# Patient Record
Sex: Female | Born: 1945 | Race: White | Hispanic: No | Smoking: Never smoker
Health system: Southern US, Community
[De-identification: ages and names within clinical notes are randomized; demographics above are authoritative.]

## PROBLEM LIST (undated history)

## (undated) DIAGNOSIS — E785 Hyperlipidemia, unspecified: Secondary | ICD-10-CM

## (undated) DIAGNOSIS — I10 Essential (primary) hypertension: Secondary | ICD-10-CM

## (undated) HISTORY — PX: BACK SURGERY: SHX140

## (undated) HISTORY — PX: ABDOMINAL HYSTERECTOMY: SHX81

---

## 2008-09-10 HISTORY — PX: TOTAL KNEE ARTHROPLASTY: SHX125

## 2012-01-24 ENCOUNTER — Encounter (HOSPITAL_COMMUNITY): Payer: Self-pay | Admitting: Pharmacy Technician

## 2012-01-24 ENCOUNTER — Other Ambulatory Visit: Payer: Self-pay | Admitting: Cardiovascular Disease

## 2012-01-25 ENCOUNTER — Encounter (HOSPITAL_COMMUNITY): Payer: Self-pay | Admitting: Cardiology

## 2012-01-25 ENCOUNTER — Ambulatory Visit (HOSPITAL_COMMUNITY)
Admission: RE | Admit: 2012-01-25 | Discharge: 2012-01-25 | Disposition: A | Discharge status: Home / Self Care | Payer: Medicare Other | Source: Ambulatory Visit | Attending: Cardiovascular Disease | Admitting: Cardiovascular Disease

## 2012-01-25 ENCOUNTER — Encounter (HOSPITAL_COMMUNITY)
Admission: RE | Disposition: A | Discharge status: Home / Self Care | Payer: Self-pay | Source: Ambulatory Visit | Attending: Cardiovascular Disease

## 2012-01-25 DIAGNOSIS — G629 Polyneuropathy, unspecified: Secondary | ICD-10-CM | POA: Diagnosis present

## 2012-01-25 DIAGNOSIS — I2510 Atherosclerotic heart disease of native coronary artery without angina pectoris: Secondary | ICD-10-CM | POA: Insufficient documentation

## 2012-01-25 DIAGNOSIS — R079 Chest pain, unspecified: Secondary | ICD-10-CM | POA: Insufficient documentation

## 2012-01-25 DIAGNOSIS — I200 Unstable angina: Secondary | ICD-10-CM | POA: Diagnosis present

## 2012-01-25 DIAGNOSIS — I10 Essential (primary) hypertension: Secondary | ICD-10-CM | POA: Diagnosis present

## 2012-01-25 DIAGNOSIS — E669 Obesity, unspecified: Secondary | ICD-10-CM | POA: Diagnosis present

## 2012-01-25 DIAGNOSIS — E785 Hyperlipidemia, unspecified: Secondary | ICD-10-CM | POA: Diagnosis present

## 2012-01-25 DIAGNOSIS — R0602 Shortness of breath: Secondary | ICD-10-CM | POA: Insufficient documentation

## 2012-01-25 DIAGNOSIS — Z8249 Family history of ischemic heart disease and other diseases of the circulatory system: Secondary | ICD-10-CM

## 2012-01-25 DIAGNOSIS — R931 Abnormal findings on diagnostic imaging of heart and coronary circulation: Secondary | ICD-10-CM | POA: Diagnosis present

## 2012-01-25 DIAGNOSIS — E119 Type 2 diabetes mellitus without complications: Secondary | ICD-10-CM | POA: Diagnosis present

## 2012-01-25 HISTORY — DX: Hyperlipidemia, unspecified: E78.5

## 2012-01-25 HISTORY — DX: Essential (primary) hypertension: I10

## 2012-01-25 HISTORY — PX: LEFT HEART CATHETERIZATION WITH CORONARY ANGIOGRAM: SHX5451

## 2012-01-25 LAB — CBC
MCH: 28 pg (ref 26.0–34.0)
MCHC: 33.1 g/dL (ref 30.0–36.0)
MCV: 84.5 fL (ref 78.0–100.0)
Platelets: 219 10*3/uL (ref 150–400)
RDW: 15.2 % (ref 11.5–15.5)
WBC: 9 10*3/uL (ref 4.0–10.5)

## 2012-01-25 LAB — BASIC METABOLIC PANEL
CO2: 28 mEq/L (ref 19–32)
Calcium: 9.4 mg/dL (ref 8.4–10.5)
Creatinine, Ser: 0.7 mg/dL (ref 0.50–1.10)
Glucose, Bld: 101 mg/dL — ABNORMAL HIGH (ref 70–99)

## 2012-01-25 SURGERY — LEFT HEART CATHETERIZATION WITH CORONARY ANGIOGRAM
Anesthesia: LOCAL

## 2012-01-25 MED ORDER — ONDANSETRON HCL 4 MG/2ML IJ SOLN: 4.0000 mg | Freq: Four times a day (QID) | INTRAMUSCULAR | Status: DC | PRN

## 2012-01-25 MED ORDER — ALPRAZOLAM 0.25 MG PO TABS: 0.2500 mg | ORAL_TABLET | Freq: Three times a day (TID) | ORAL | Status: DC | PRN

## 2012-01-25 MED ORDER — ASPIRIN 81 MG PO CHEW: 81.0000 mg | CHEWABLE_TABLET | Freq: Every day | ORAL | Status: DC

## 2012-01-25 MED ORDER — HEPARIN (PORCINE) IN NACL 2-0.9 UNIT/ML-% IJ SOLN
INTRAMUSCULAR | Status: AC
  Filled 2012-01-25: qty 2000

## 2012-01-25 MED ORDER — SODIUM CHLORIDE 0.9 % IV SOLN: INTRAVENOUS | Status: DC

## 2012-01-25 MED ORDER — NITROGLYCERIN 0.2 MG/ML ON CALL CATH LAB
INTRAVENOUS | Status: AC
  Filled 2012-01-25: qty 1

## 2012-01-25 MED ORDER — SODIUM CHLORIDE 0.9 % IJ SOLN: 3.0000 mL | INTRAMUSCULAR | Status: DC | PRN

## 2012-01-25 MED ORDER — ACETAMINOPHEN 325 MG PO TABS: 650.0000 mg | ORAL_TABLET | ORAL | Status: DC | PRN

## 2012-01-25 MED ORDER — DIAZEPAM 5 MG PO TABS
ORAL_TABLET | ORAL | Status: AC
  Filled 2012-01-25: qty 1

## 2012-01-25 MED ORDER — DIAZEPAM 5 MG PO TABS
5.0000 mg | ORAL_TABLET | ORAL | Status: AC
  Administered 2012-01-25: 5 mg via ORAL

## 2012-01-25 MED ORDER — INSULIN ASPART 100 UNIT/ML ~~LOC~~ SOLN: 0.0000 [IU] | Freq: Three times a day (TID) | SUBCUTANEOUS | Status: DC

## 2012-01-25 MED ORDER — SODIUM CHLORIDE 0.9 % IV SOLN
INTRAVENOUS | Status: DC
  Administered 2012-01-25: 13:00:00 via INTRAVENOUS

## 2012-01-25 MED ORDER — ZOLPIDEM TARTRATE 10 MG PO TABS: 10.0000 mg | ORAL_TABLET | Freq: Every evening | ORAL | Status: DC | PRN

## 2012-01-25 MED ORDER — INSULIN ASPART 100 UNIT/ML ~~LOC~~ SOLN: 0.0000 [IU] | Freq: Every day | SUBCUTANEOUS | Status: DC

## 2012-01-25 MED ORDER — ASPIRIN 81 MG PO CHEW
CHEWABLE_TABLET | ORAL | Status: AC
  Filled 2012-01-25: qty 4

## 2012-01-25 MED ORDER — LIDOCAINE HCL (PF) 1 % IJ SOLN
INTRAMUSCULAR | Status: AC
  Filled 2012-01-25: qty 30

## 2012-01-25 NOTE — H&P (Signed)
Patient ID: Brandi Marquez MRN: [id number], DOB/AGE: 68-Jun-1947   Admit date: 01/25/2012   Primary Physician: No primary provider on file. Primary Cardiologist: Brandi Felts PA-C  HPI: Pleasant 66 y/o female with a history of HTN, DM, dyslipidemia, as well as a family history of CAD, referred to [place] for diagnostic cath. Brandi Marquez had a work up in Sept 2009 for SOB. Echo showed an EF of 70% with LVH. Myoview was abnormal with scar and superimposed ischemia. She was treated medically. Monday this week she was driving her grandson home from school when she developed Lt su scapular pain-"like a knife". Her symptoms were worse with inspiration adn she felt SOB. [facility]. In retrospect, the admits to a history of "weak spells" related to exertion and associated with diaphoresis which she attributed to low blood sugar. She saw Brandi Marquez earlier this weak and was put on Imdur and set up for cath. She has not had any more chest pain but she says she was up most of the night with "aching" pain in her Lt arm.   Problem List: Past Medical History  Diagnosis Date  . Diabetes mellitus   . HTN (hypertension)   . Dyslipidemia     Past Surgical History  Procedure Date  . Back surgery '95 X 2  . Abdominal hysterectomy   . Total knee arthroplasty 2010    Rt     Allergies:  Allergies  Allergen Reactions  . Codeine      Home Medications Prescriptions prior to admission  Medication Sig Dispense Refill  . escitalopram (LEXAPRO) 20 MG tablet Take 20 mg by mouth daily.      Marland Kitchen esomeprazole (NEXIUM) 40 MG capsule Take 40 mg by mouth 2 (two) times daily.      . fenofibrate (TRICOR) 48 MG tablet Take 48 mg by mouth daily.      . metFORMIN (GLUCOPHAGE-XR) 500 MG 24 hr tablet Take 500 mg by mouth daily with breakfast.      . pregabalin (LYRICA) 150 MG capsule Take 150 mg by mouth 2 (two) times daily.      Marland Kitchen PRESCRIPTION MEDICATION Apply 1 application topically 4 (four) times daily.  Compounded cream from "[facility] [employer]". This medication has several ingredients which are unknown to pt      . ramipril (ALTACE) 10 MG capsule Take 10 mg by mouth daily.      . simvastatin (ZOCOR) 20 MG tablet Take 20 mg by mouth every evening.         Family History  Problem Relation Age of Onset  . Coronary artery disease Father 31     History   Social History  . Marital Status: Married    Spouse Name: N/A    Number of Children: 2  . Years of Education: N/A   Occupational History  . Not on file.   Social History Main Topics  . Smoking status: Never Smoker   . Smokeless tobacco: Not on file  . Alcohol Use: Not on file  . Drug Use: Not on file  . Sexually Active: Not on file   Other Topics Concern  . Not on file   Social History Narrative  . No narrative on file     Review of Systems: No history of GI bleeding No history of renal disease. No retinopathy or neuropathy from DM. She has been Rxd for "prerssure" on her eyes in the past.  No history  of syncope or palpitations. She has chronic numbness Rt foot after back surgery in '95.     Physical Exam: Blood pressure 159/83, pulse 65, temperature 97.8 F (36.6 C), temperature source Oral, resp. rate 18, height 5\' 8"  (1.727 m), weight 99.791 kg (220 lb), SpO2 92.00%.  General appearance: alert, cooperative, no distress and morbidly obese Neck: no carotid bruit, no JVD, supple, symmetrical, trachea midline and thyroid not enlarged, symmetric, no tenderness/mass/nodules Lungs: clear to auscultation bilaterally Heart: regular rate and rhythm, S1, S2 normal, no murmur, click, rub or gallop Abdomen: obese, midline surgical scar Extremities: extremities normal, atraumatic, no cyanosis or edema Pulses: 2+ and symmetric Skin: cool and dry Neurologic: Grossly normal    Labs:   Results for orders placed during the hospital encounter of 01/25/12 (from the past 24 hour(s))  CBC     Status: Normal   Collection  Time   01/25/12  1:34 PM      Component Value Range   WBC 9.0  4.0 - 10.5 (K/uL)   RBC 4.40  3.87 - 5.11 (MIL/uL)   Hemoglobin 12.3  12.0 - 15.0 (g/dL)   HCT 16.1  09.6 - 04.5 (%)   MCV 84.5  78.0 - 100.0 (fL)   MCH 28.0  26.0 - 34.0 (pg)   MCHC 33.1  30.0 - 36.0 (g/dL)   RDW 40.9  81.1 - 91.4 (%)   Platelets 219  150 - 400 (K/uL)  PROTIME-INR     Status: Normal   Collection Time   01/25/12  1:34 PM      Component Value Range   Prothrombin Time 13.7  11.6 - 15.2 (seconds)   INR 1.03  0.00 - 1.49      Radiology/Studies: No results found.  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 Her chest pain Monday is not as worrisome as her history of exertional weak spells and Lt arm pain last night.  Brandi Pretty, PA-C 01/25/2012, 2:05 PM potassium Agree with note written by Brandi Marquez PAC  Pt with CRF, recent CP and positive myoview for cath today.  Runell Gess 01/25/2012 3:56 PM

## 2012-01-25 NOTE — H&P (Signed)
   Pt was reexamined and existing H & P reviewed. No changes found.  Runell Gess, MD [facility] 01/25/2012 3:59 PM

## 2012-01-25 NOTE — Op Note (Signed)
Brandi Marquez is a 66 y.o. female    [id number] LOCATION:  FACILITY: [facility]  PHYSICIAN: Nanetta Batty, M.D. 1946/02/21   DATE OF PROCEDURE:  01/25/2012  DATE OF DISCHARGE:  [facility]  CARDIAC CATHETERIZATION     History obtained from chart review. Pleasant 66 y/o female with a history of HTN, DM, dyslipidemia, as well as a family history of CAD, referred to [place] for diagnostic cath. [name] had a work up in Sept 2009 for SOB. Echo showed an EF of 70% with LVH. Myoview was abnormal with scar and superimposed ischemia. She was treated medically. Monday this week she was driving her grandson home from school when she developed Lt su scapular pain-"like a knife". Her symptoms were worse with inspiration adn she felt SOB. [facility]. In retrospect, the admits to a history of "weak spells" related to exertion and associated with diaphoresis which she attributed to low blood sugar. She saw Arnette Felts earlier this weak and was put on Imdur and set up for cath. She has not had any more chest pain but she says she was up most of the night with "aching" pain in her Lt arm.    PROCEDURE DESCRIPTION:    The patient was brought to the second floor  [facility] lab in the postabsorptive state. She was  premedicated with Valium 5 mg by mouth. Her right groin was prepped and shaved in usual sterile fashion. Xylocaine 1% was used  for local anesthesia. A 5 French sheath was inserted into the right common femoral  artery using standard Seldinger technique. 5 French right and left Judkins diagnostic catheters along with a 5 French pigtail catheter were used for selective coronary angiography and left ventriculography respectively. Visipaque was used for the entirety of the case. Retrograde aortic and left ventricular pullback pressures were recorded.  HEMODYNAMICS:    AO SYSTOLIC/AO DIASTOLIC: 193/86   LV SYSTOLIC/LV DIASTOLIC:  190/20  ANGIOGRAPHIC RESULTS:   1. Left main; normal  2. LAD; minor irregularities 3. Left circumflex; normal.  4. Right coronary artery; dominant with a 40-50% focal mid PDA stenosis 5. Left ventriculography; RAO left ventriculogram was performed using  25 mL of Visipaque dye at 12 mL/second. The overall LVEF estimated  60 %Without wall motion abnormalities  IMPRESSION:Ms. Milford has essentially noncritical CAD with a moderate mid PDA stenosis which does not appear to be hemodynamically significant. I believe her chest pain is noncardiac and her mildly false positive. I recommend empiric anti-reflux therapy. The sheath was removed and pressure was held on the groin to achieve hemostasis. The patient left the lab in stable condition. She'll gently hydrated, discharged home after remaining recumbent for 4 hours. She'll see Arnette Felts, at [facility], back in followup.  Runell Gess MD, [facility] 01/25/2012 4:27 PM

## 2012-01-25 NOTE — Progress Notes (Signed)
UP AND WALKED AND TOLERATED WELL RIGHT GROIN STABLE;NO BLEEDING OR HEMATOMA; VOIDED IN BATHROOM

## 2012-01-25 NOTE — Discharge Instructions (Signed)

## 2013-02-10 ENCOUNTER — Other Ambulatory Visit (HOSPITAL_COMMUNITY): Payer: Self-pay | Admitting: Endocrinology

## 2013-02-10 DIAGNOSIS — C73 Malignant neoplasm of thyroid gland: Secondary | ICD-10-CM

## 2013-02-16 ENCOUNTER — Encounter (HOSPITAL_COMMUNITY)
Admission: RE | Admit: 2013-02-16 | Discharge: 2013-02-16 | Disposition: A | Discharge status: Home / Self Care | Payer: Medicare Other | Source: Ambulatory Visit | Attending: Endocrinology | Admitting: Endocrinology

## 2013-02-16 DIAGNOSIS — C73 Malignant neoplasm of thyroid gland: Secondary | ICD-10-CM | POA: Insufficient documentation

## 2013-02-16 MED ORDER — THYROTROPIN ALFA 1.1 MG IM SOLR
0.9000 mg | INTRAMUSCULAR | Status: AC
  Administered 2013-02-16: 0.9 mg via INTRAMUSCULAR

## 2013-02-17 ENCOUNTER — Encounter (HOSPITAL_COMMUNITY)
Admission: RE | Admit: 2013-02-17 | Discharge: 2013-02-17 | Disposition: A | Discharge status: Home / Self Care | Payer: Medicare Other | Source: Ambulatory Visit | Attending: Endocrinology | Admitting: Endocrinology

## 2013-02-17 MED ORDER — THYROTROPIN ALFA 1.1 MG IM SOLR
0.9000 mg | INTRAMUSCULAR | Status: AC
  Administered 2013-02-17: 0.9 mg via INTRAMUSCULAR

## 2013-02-18 ENCOUNTER — Encounter (HOSPITAL_COMMUNITY)
Admission: RE | Admit: 2013-02-18 | Discharge: 2013-02-18 | Disposition: A | Discharge status: Home / Self Care | Payer: Medicare Other | Source: Ambulatory Visit | Attending: Endocrinology | Admitting: Endocrinology

## 2013-02-18 MED ORDER — SODIUM IODIDE I 131 CAPSULE
77.7000 | Freq: Once | INTRAVENOUS | Status: AC | PRN
  Administered 2013-02-18: 77.7 via ORAL

## 2013-02-27 ENCOUNTER — Encounter (HOSPITAL_COMMUNITY)
Admission: RE | Admit: 2013-02-27 | Discharge: 2013-02-27 | Disposition: A | Discharge status: Home / Self Care | Payer: Medicare Other | Source: Ambulatory Visit | Attending: Endocrinology | Admitting: Endocrinology

## 2013-02-27 DIAGNOSIS — C73 Malignant neoplasm of thyroid gland: Secondary | ICD-10-CM | POA: Insufficient documentation

## 2014-03-01 ENCOUNTER — Other Ambulatory Visit (HOSPITAL_COMMUNITY): Payer: Self-pay | Admitting: Endocrinology

## 2014-03-01 DIAGNOSIS — C73 Malignant neoplasm of thyroid gland: Secondary | ICD-10-CM

## 2014-03-22 ENCOUNTER — Encounter (HOSPITAL_COMMUNITY)
Admission: RE | Admit: 2014-03-22 | Discharge: 2014-03-22 | Disposition: A | Discharge status: Home / Self Care | Payer: Medicare Other | Source: Ambulatory Visit | Attending: Endocrinology | Admitting: Endocrinology

## 2014-03-22 DIAGNOSIS — C73 Malignant neoplasm of thyroid gland: Secondary | ICD-10-CM | POA: Insufficient documentation

## 2014-03-22 MED ORDER — THYROTROPIN ALFA 1.1 MG IM SOLR
0.9000 mg | INTRAMUSCULAR | Status: AC
  Administered 2014-03-22: 0.9 mg via INTRAMUSCULAR

## 2014-03-23 ENCOUNTER — Encounter (HOSPITAL_COMMUNITY)
Admission: RE | Admit: 2014-03-23 | Discharge: 2014-03-23 | Disposition: A | Discharge status: Home / Self Care | Payer: Medicare Other | Source: Ambulatory Visit | Attending: Endocrinology | Admitting: Endocrinology

## 2014-03-23 DIAGNOSIS — C73 Malignant neoplasm of thyroid gland: Secondary | ICD-10-CM | POA: Insufficient documentation

## 2014-03-23 MED ORDER — THYROTROPIN ALFA 1.1 MG IM SOLR
0.9000 mg | INTRAMUSCULAR | Status: AC
  Administered 2014-03-23: 0.9 mg via INTRAMUSCULAR
  Filled 2014-03-23: qty 0.9

## 2014-03-24 ENCOUNTER — Encounter (HOSPITAL_COMMUNITY)
Admission: RE | Admit: 2014-03-24 | Discharge: 2014-03-24 | Disposition: A | Discharge status: Home / Self Care | Payer: Medicare Other | Source: Ambulatory Visit | Attending: Endocrinology | Admitting: Endocrinology

## 2014-03-24 DIAGNOSIS — C73 Malignant neoplasm of thyroid gland: Secondary | ICD-10-CM | POA: Insufficient documentation

## 2014-03-26 ENCOUNTER — Encounter (HOSPITAL_COMMUNITY)
Admission: RE | Admit: 2014-03-26 | Discharge: 2014-03-26 | Disposition: A | Discharge status: Home / Self Care | Payer: Medicare Other | Source: Ambulatory Visit | Attending: Endocrinology | Admitting: Endocrinology

## 2014-03-26 DIAGNOSIS — C73 Malignant neoplasm of thyroid gland: Secondary | ICD-10-CM | POA: Insufficient documentation

## 2014-03-26 MED ORDER — SODIUM IODIDE I 131 CAPSULE
4.0000 | Freq: Once | INTRAVENOUS | Status: AC | PRN
  Administered 2014-03-26: 4 via ORAL

## 2014-03-29 LAB — THYROGLOBULIN ANTIBODY: Thyroglobulin Ab: <20 IU/mL (ref <40.0)

## 2014-03-29 LAB — THYROGLOBULIN LEVEL

## 2014-08-19 ENCOUNTER — Encounter (HOSPITAL_COMMUNITY): Payer: Self-pay | Admitting: Cardiovascular Disease

## 2023-07-10 ENCOUNTER — Other Ambulatory Visit (HOSPITAL_COMMUNITY): Payer: Self-pay | Admitting: *Deleted

## 2023-07-10 DIAGNOSIS — I2510 Atherosclerotic heart disease of native coronary artery without angina pectoris: Secondary | ICD-10-CM

## 2023-07-15 ENCOUNTER — Telehealth (HOSPITAL_COMMUNITY): Payer: Self-pay | Admitting: Emergency Medicine

## 2023-07-15 DIAGNOSIS — R079 Chest pain, unspecified: Secondary | ICD-10-CM

## 2023-07-15 MED ORDER — METOPROLOL TARTRATE 50 MG PO TABS: 50.0000 mg | ORAL_TABLET | Freq: Once | ORAL | 0 refills | Status: AC

## 2023-07-15 NOTE — Telephone Encounter (Signed)
Reaching out to patient to offer assistance regarding upcoming cardiac imaging study; pt verbalizes understanding of appt date/time, parking situation and where to check in, pre-test NPO status and medications ordered, and verified current allergies; name and call back number provided for further questions should they arise Rockwell Alexandria RN Navigator Cardiac Imaging [facility] [phone] office [phone] cell   Called patient as well as daughter to review instructions

## 2023-07-16 ENCOUNTER — Ambulatory Visit (HOSPITAL_BASED_OUTPATIENT_CLINIC_OR_DEPARTMENT_OTHER)
Admission: RE | Admit: 2023-07-16 | Discharge: 2023-07-16 | Disposition: A | Discharge status: Home / Self Care | Payer: Medicare Other | Source: Ambulatory Visit | Attending: Family Medicine | Admitting: Family Medicine

## 2023-07-16 ENCOUNTER — Other Ambulatory Visit: Payer: Self-pay | Admitting: Cardiology

## 2023-07-16 ENCOUNTER — Encounter (HOSPITAL_BASED_OUTPATIENT_CLINIC_OR_DEPARTMENT_OTHER): Payer: Self-pay

## 2023-07-16 ENCOUNTER — Ambulatory Visit (HOSPITAL_BASED_OUTPATIENT_CLINIC_OR_DEPARTMENT_OTHER)
Admission: RE | Admit: 2023-07-16 | Discharge: 2023-07-16 | Disposition: A | Discharge status: Home / Self Care | Payer: Self-pay | Source: Ambulatory Visit | Attending: Cardiology | Admitting: Cardiology

## 2023-07-16 DIAGNOSIS — I2510 Atherosclerotic heart disease of native coronary artery without angina pectoris: Secondary | ICD-10-CM | POA: Diagnosis present

## 2023-07-16 DIAGNOSIS — R931 Abnormal findings on diagnostic imaging of heart and coronary circulation: Secondary | ICD-10-CM | POA: Insufficient documentation

## 2023-07-16 MED ORDER — IOHEXOL 350 MG/ML SOLN
100.0000 mL | Freq: Once | INTRAVENOUS | Status: AC | PRN
  Administered 2023-07-16: 95 mL via INTRAVENOUS

## 2023-07-16 MED ORDER — NITROGLYCERIN 0.4 MG SL SUBL
0.8000 mg | SUBLINGUAL_TABLET | Freq: Once | SUBLINGUAL | Status: AC
  Administered 2023-07-16: 0.8 mg via SUBLINGUAL

## 2023-07-16 NOTE — Progress Notes (Signed)
FFR Order
